# Patient Record
Sex: Male | Born: 1935 | Race: White | Hispanic: No | Marital: Single
Health system: Southern US, Community
[De-identification: ages and names within clinical notes are randomized; demographics above are authoritative.]

---

## 1998-04-22 ENCOUNTER — Inpatient Hospital Stay (HOSPITAL_COMMUNITY): Admission: RE | Admit: 1998-04-22 | Discharge: 1998-04-30 | Payer: Self-pay | Admitting: Vascular Surgery

## 2000-01-03 ENCOUNTER — Ambulatory Visit (HOSPITAL_COMMUNITY): Admission: RE | Admit: 2000-01-03 | Discharge: 2000-01-03 | Payer: Self-pay | Admitting: Ophthalmology

## 2000-06-29 ENCOUNTER — Encounter: Payer: Self-pay | Admitting: Orthopedic Surgery

## 2000-07-10 ENCOUNTER — Inpatient Hospital Stay (HOSPITAL_COMMUNITY): Admission: RE | Admit: 2000-07-10 | Discharge: 2000-07-15 | Payer: Self-pay | Admitting: Orthopedic Surgery

## 2000-10-04 ENCOUNTER — Encounter (INDEPENDENT_AMBULATORY_CARE_PROVIDER_SITE_OTHER): Payer: Self-pay | Admitting: Specialist

## 2000-10-04 ENCOUNTER — Ambulatory Visit (HOSPITAL_COMMUNITY): Admission: RE | Admit: 2000-10-04 | Discharge: 2000-10-04 | Payer: Self-pay | Admitting: Gastroenterology

## 2001-08-20 ENCOUNTER — Ambulatory Visit (HOSPITAL_COMMUNITY): Admission: RE | Admit: 2001-08-20 | Discharge: 2001-08-20 | Payer: Self-pay | Admitting: Ophthalmology

## 2001-08-20 ENCOUNTER — Encounter: Payer: Self-pay | Admitting: Ophthalmology

## 2006-06-29 ENCOUNTER — Inpatient Hospital Stay (HOSPITAL_COMMUNITY): Admission: AD | Admit: 2006-06-29 | Discharge: 2006-07-02 | Payer: Self-pay | Admitting: Internal Medicine

## 2006-08-16 ENCOUNTER — Encounter: Admission: RE | Admit: 2006-08-16 | Discharge: 2006-08-16 | Payer: Self-pay | Admitting: Neurology

## 2008-10-18 ENCOUNTER — Emergency Department (HOSPITAL_COMMUNITY): Admission: EM | Admit: 2008-10-18 | Discharge: 2008-10-18 | Payer: Self-pay | Admitting: Emergency Medicine

## 2008-11-23 ENCOUNTER — Inpatient Hospital Stay (HOSPITAL_COMMUNITY): Admission: EM | Admit: 2008-11-23 | Discharge: 2008-12-02 | Payer: Self-pay | Admitting: Emergency Medicine

## 2009-05-06 ENCOUNTER — Emergency Department (HOSPITAL_COMMUNITY): Admission: EM | Admit: 2009-05-06 | Discharge: 2009-05-06 | Payer: Self-pay | Admitting: Emergency Medicine

## 2010-01-03 ENCOUNTER — Emergency Department (HOSPITAL_COMMUNITY): Admission: EM | Admit: 2010-01-03 | Discharge: 2010-01-04 | Payer: Self-pay | Admitting: Emergency Medicine

## 2010-04-10 ENCOUNTER — Inpatient Hospital Stay (HOSPITAL_COMMUNITY): Admission: EM | Admit: 2010-04-10 | Discharge: 2010-04-12 | Payer: Self-pay | Admitting: Emergency Medicine

## 2010-10-22 ENCOUNTER — Emergency Department (HOSPITAL_COMMUNITY)
Admission: EM | Admit: 2010-10-22 | Discharge: 2010-10-23 | Payer: Self-pay | Source: Home / Self Care | Admitting: Emergency Medicine

## 2010-10-23 ENCOUNTER — Inpatient Hospital Stay (HOSPITAL_COMMUNITY)
Admission: EM | Admit: 2010-10-23 | Discharge: 2010-10-28 | Disposition: A | Discharge status: Hospice | Payer: Self-pay | Source: Home / Self Care

## 2011-01-31 LAB — URINALYSIS, ROUTINE W REFLEX MICROSCOPIC
Bilirubin Urine: NEGATIVE
Ketones, ur: 15 mg/dL — AB
Leukocytes, UA: NEGATIVE
Nitrite: NEGATIVE
Protein, ur: 30 mg/dL — AB

## 2011-01-31 LAB — CBC
HCT: 27.8 % — ABNORMAL LOW (ref 39.0–52.0)
HCT: 31.5 % — ABNORMAL LOW (ref 39.0–52.0)
HCT: 31.7 % — ABNORMAL LOW (ref 39.0–52.0)
Hemoglobin: 10.1 g/dL — ABNORMAL LOW (ref 13.0–17.0)
Hemoglobin: 10.3 g/dL — ABNORMAL LOW (ref 13.0–17.0)
Hemoglobin: 12.4 g/dL — ABNORMAL LOW (ref 13.0–17.0)
Hemoglobin: 12.9 g/dL — ABNORMAL LOW (ref 13.0–17.0)
Hemoglobin: 9.9 g/dL — ABNORMAL LOW (ref 13.0–17.0)
MCH: 31.1 pg (ref 26.0–34.0)
MCH: 31.3 pg (ref 26.0–34.0)
MCH: 31.8 pg (ref 26.0–34.0)
MCHC: 32.1 g/dL (ref 30.0–36.0)
MCHC: 33.5 g/dL (ref 30.0–36.0)
MCHC: 33.7 g/dL (ref 30.0–36.0)
MCV: 93.3 fL (ref 78.0–100.0)
MCV: 93.7 fL (ref 78.0–100.0)
MCV: 94.5 fL (ref 78.0–100.0)
MCV: 96.4 fL (ref 78.0–100.0)
Platelets: 146 10*3/uL — ABNORMAL LOW (ref 150–400)
Platelets: 82 10*3/uL — ABNORMAL LOW (ref 150–400)
RBC: 3.11 MIL/uL — ABNORMAL LOW (ref 4.22–5.81)
RBC: 3.25 MIL/uL — ABNORMAL LOW (ref 4.22–5.81)
RBC: 3.29 MIL/uL — ABNORMAL LOW (ref 4.22–5.81)
RBC: 3.99 MIL/uL — ABNORMAL LOW (ref 4.22–5.81)
RBC: 4.15 MIL/uL — ABNORMAL LOW (ref 4.22–5.81)
RDW: 14.1 % (ref 11.5–15.5)
RDW: 14.2 % (ref 11.5–15.5)
WBC: 11.2 10*3/uL — ABNORMAL HIGH (ref 4.0–10.5)
WBC: 6 10*3/uL (ref 4.0–10.5)
WBC: 8.4 10*3/uL (ref 4.0–10.5)

## 2011-01-31 LAB — BASIC METABOLIC PANEL
BUN: 6 mg/dL (ref 6–23)
CO2: 17 mEq/L — ABNORMAL LOW (ref 19–32)
CO2: 19 mEq/L (ref 19–32)
CO2: 20 mEq/L (ref 19–32)
CO2: 21 mEq/L (ref 19–32)
Calcium: 7.5 mg/dL — ABNORMAL LOW (ref 8.4–10.5)
Calcium: 8.1 mg/dL — ABNORMAL LOW (ref 8.4–10.5)
Chloride: 112 mEq/L (ref 96–112)
Chloride: 113 mEq/L — ABNORMAL HIGH (ref 96–112)
Chloride: 113 mEq/L — ABNORMAL HIGH (ref 96–112)
Creatinine, Ser: 0.66 mg/dL (ref 0.4–1.5)
Creatinine, Ser: 0.94 mg/dL (ref 0.4–1.5)
GFR calc Af Amer: >60 mL/min (ref >60)
GFR calc Af Amer: >60 mL/min (ref >60)
GFR calc Af Amer: >60 mL/min (ref >60)
GFR calc Af Amer: >60 mL/min (ref >60)
GFR calc non Af Amer: >60 mL/min (ref >60)
GFR calc non Af Amer: >60 mL/min (ref >60)
Glucose, Bld: 197 mg/dL — ABNORMAL HIGH (ref 70–99)
Glucose, Bld: 76 mg/dL (ref 70–99)
Glucose, Bld: 84 mg/dL (ref 70–99)
Potassium: 3 mEq/L — ABNORMAL LOW (ref 3.5–5.1)
Potassium: 3.3 mEq/L — ABNORMAL LOW (ref 3.5–5.1)
Potassium: 3.8 mEq/L (ref 3.5–5.1)
Potassium: 3.8 mEq/L (ref 3.5–5.1)
Sodium: 136 mEq/L (ref 135–145)
Sodium: 137 mEq/L (ref 135–145)
Sodium: 138 mEq/L (ref 135–145)
Sodium: 138 mEq/L (ref 135–145)

## 2011-01-31 LAB — POCT I-STAT, CHEM 8
Calcium, Ion: 1.11 mmol/L — ABNORMAL LOW (ref 1.12–1.32)
Creatinine, Ser: 0.8 mg/dL (ref 0.4–1.5)
TCO2: 24 mmol/L (ref 0–100)

## 2011-01-31 LAB — MAGNESIUM
Magnesium: 1.8 mg/dL (ref 1.5–2.5)
Magnesium: 1.8 mg/dL (ref 1.5–2.5)

## 2011-01-31 LAB — HEPATIC FUNCTION PANEL
Albumin: 3.8 g/dL (ref 3.5–5.2)
Alkaline Phosphatase: 64 U/L (ref 39–117)
Indirect Bilirubin: 0.9 mg/dL (ref 0.3–0.9)
Total Bilirubin: 1.1 mg/dL (ref 0.3–1.2)

## 2011-01-31 LAB — URINE CULTURE

## 2011-01-31 LAB — COMPREHENSIVE METABOLIC PANEL
Albumin: 2.1 g/dL — ABNORMAL LOW (ref 3.5–5.2)
BUN: 14 mg/dL (ref 6–23)
Calcium: 7.2 mg/dL — ABNORMAL LOW (ref 8.4–10.5)
Creatinine, Ser: 0.63 mg/dL (ref 0.4–1.5)
Glucose, Bld: 92 mg/dL (ref 70–99)
Total Protein: 4.1 g/dL — ABNORMAL LOW (ref 6.0–8.3)

## 2011-01-31 LAB — DIFFERENTIAL
Basophils Absolute: 0 10*3/uL (ref 0.0–0.1)
Basophils Relative: 0 % (ref 0–1)
Eosinophils Absolute: 0 10*3/uL (ref 0.0–0.7)
Eosinophils Relative: 0 % (ref 0–5)
Eosinophils Relative: 0 % (ref 0–5)
Neutro Abs: 12.1 10*3/uL — ABNORMAL HIGH (ref 1.7–7.7)
Neutro Abs: 7.5 10*3/uL (ref 1.7–7.7)
Neutrophils Relative %: 90 % — ABNORMAL HIGH (ref 43–77)

## 2011-01-31 LAB — PROTIME-INR: INR: 1.12 (ref 0.00–1.49)

## 2011-01-31 LAB — LIPASE, BLOOD: Lipase: 28 U/L (ref 11–59)

## 2011-01-31 LAB — URINE MICROSCOPIC-ADD ON

## 2011-01-31 LAB — POTASSIUM: Potassium: 3.4 mEq/L — ABNORMAL LOW (ref 3.5–5.1)

## 2011-01-31 LAB — TYPE AND SCREEN: Antibody Screen: NEGATIVE

## 2011-02-06 LAB — BASIC METABOLIC PANEL
BUN: 21 mg/dL (ref 6–23)
BUN: 24 mg/dL — ABNORMAL HIGH (ref 6–23)
Calcium: 8.1 mg/dL — ABNORMAL LOW (ref 8.4–10.5)
Creatinine, Ser: 0.88 mg/dL (ref 0.4–1.5)
GFR calc non Af Amer: >60 mL/min (ref >60)
GFR calc non Af Amer: >60 mL/min (ref >60)
Glucose, Bld: 102 mg/dL — ABNORMAL HIGH (ref 70–99)
Glucose, Bld: 87 mg/dL (ref 70–99)
Potassium: 3.9 mEq/L (ref 3.5–5.1)
Potassium: 4.4 mEq/L (ref 3.5–5.1)

## 2011-02-06 LAB — CULTURE, BLOOD (ROUTINE X 2): Culture: NO GROWTH

## 2011-02-06 LAB — CBC
HCT: 29.3 % — ABNORMAL LOW (ref 39.0–52.0)
HCT: 31.2 % — ABNORMAL LOW (ref 39.0–52.0)
HCT: 33.5 % — ABNORMAL LOW (ref 39.0–52.0)
Hemoglobin: 10 g/dL — ABNORMAL LOW (ref 13.0–17.0)
MCV: 95.3 fL (ref 78.0–100.0)
MCV: 95.8 fL (ref 78.0–100.0)
Platelets: 111 10*3/uL — ABNORMAL LOW (ref 150–400)
Platelets: 91 10*3/uL — ABNORMAL LOW (ref 150–400)
RBC: 3.07 MIL/uL — ABNORMAL LOW (ref 4.22–5.81)
RDW: 13.8 % (ref 11.5–15.5)
RDW: 14 % (ref 11.5–15.5)
WBC: 10.7 10*3/uL — ABNORMAL HIGH (ref 4.0–10.5)
WBC: 5.2 10*3/uL (ref 4.0–10.5)

## 2011-02-06 LAB — URINALYSIS, ROUTINE W REFLEX MICROSCOPIC
Bilirubin Urine: NEGATIVE
Specific Gravity, Urine: 1.011 (ref 1.005–1.030)
pH: 6 (ref 5.0–8.0)

## 2011-02-06 LAB — DIFFERENTIAL
Basophils Absolute: 0 10*3/uL (ref 0.0–0.1)
Basophils Absolute: 0 10*3/uL (ref 0.0–0.1)
Eosinophils Absolute: 0 10*3/uL (ref 0.0–0.7)
Eosinophils Relative: 0 % (ref 0–5)
Eosinophils Relative: 0 % (ref 0–5)
Lymphocytes Relative: 5 % — ABNORMAL LOW (ref 12–46)
Lymphocytes Relative: 5 % — ABNORMAL LOW (ref 12–46)
Lymphs Abs: 0.5 10*3/uL — ABNORMAL LOW (ref 0.7–4.0)
Neutro Abs: 10.1 10*3/uL — ABNORMAL HIGH (ref 1.7–7.7)
Neutrophils Relative %: 92 % — ABNORMAL HIGH (ref 43–77)
Neutrophils Relative %: 95 % — ABNORMAL HIGH (ref 43–77)

## 2011-02-06 LAB — URINE MICROSCOPIC-ADD ON

## 2011-02-06 LAB — URINE CULTURE: Special Requests: NEGATIVE

## 2011-02-06 LAB — LIPASE, BLOOD: Lipase: 16 U/L (ref 11–59)

## 2011-02-27 LAB — COMPREHENSIVE METABOLIC PANEL
Albumin: 3.9 g/dL (ref 3.5–5.2)
Alkaline Phosphatase: 73 U/L (ref 39–117)
BUN: 28 mg/dL — ABNORMAL HIGH (ref 6–23)
Creatinine, Ser: 0.86 mg/dL (ref 0.4–1.5)
Glucose, Bld: 100 mg/dL — ABNORMAL HIGH (ref 70–99)
Potassium: 4.4 mEq/L (ref 3.5–5.1)
Total Protein: 7 g/dL (ref 6.0–8.3)

## 2011-02-27 LAB — URINALYSIS, ROUTINE W REFLEX MICROSCOPIC
Nitrite: NEGATIVE
Specific Gravity, Urine: 1.017 (ref 1.005–1.030)
Urobilinogen, UA: 1 mg/dL (ref 0.0–1.0)
pH: 7 (ref 5.0–8.0)

## 2011-02-27 LAB — CBC
HCT: 36.7 % — ABNORMAL LOW (ref 39.0–52.0)
Hemoglobin: 12.5 g/dL — ABNORMAL LOW (ref 13.0–17.0)
MCHC: 34 g/dL (ref 30.0–36.0)
MCV: 96.8 fL (ref 78.0–100.0)
Platelets: 131 10*3/uL — ABNORMAL LOW (ref 150–400)
RDW: 13.7 % (ref 11.5–15.5)

## 2011-02-27 LAB — URINE CULTURE: Colony Count: 15000

## 2011-02-27 LAB — DIFFERENTIAL
Basophils Absolute: 0 10*3/uL (ref 0.0–0.1)
Basophils Relative: 0 % (ref 0–1)
Lymphocytes Relative: 16 % (ref 12–46)
Monocytes Absolute: 0.4 10*3/uL (ref 0.1–1.0)
Monocytes Relative: 4 % (ref 3–12)
Neutro Abs: 8.4 10*3/uL — ABNORMAL HIGH (ref 1.7–7.7)
Neutrophils Relative %: 79 % — ABNORMAL HIGH (ref 43–77)

## 2011-03-04 ENCOUNTER — Emergency Department (HOSPITAL_COMMUNITY)
Admission: EM | Admit: 2011-03-04 | Discharge: 2011-03-04 | Disposition: A | Discharge status: Home / Self Care | Payer: Medicare Other | Attending: Emergency Medicine | Admitting: Emergency Medicine

## 2011-03-04 ENCOUNTER — Emergency Department (HOSPITAL_COMMUNITY): Payer: Medicare Other

## 2011-03-04 DIAGNOSIS — R4182 Altered mental status, unspecified: Secondary | ICD-10-CM | POA: Insufficient documentation

## 2011-03-04 DIAGNOSIS — G238 Other specified degenerative diseases of basal ganglia: Secondary | ICD-10-CM | POA: Insufficient documentation

## 2011-03-04 DIAGNOSIS — F0280 Dementia in other diseases classified elsewhere without behavioral disturbance: Secondary | ICD-10-CM | POA: Insufficient documentation

## 2011-03-04 DIAGNOSIS — Z66 Do not resuscitate: Secondary | ICD-10-CM | POA: Insufficient documentation

## 2011-03-04 LAB — CBC
MCH: 30.8 pg (ref 26.0–34.0)
MCHC: 33 g/dL (ref 30.0–36.0)
MCV: 93.3 fL (ref 78.0–100.0)
Platelets: 85 10*3/uL — ABNORMAL LOW (ref 150–400)
RDW: 14.2 % (ref 11.5–15.5)

## 2011-03-04 LAB — URINALYSIS, ROUTINE W REFLEX MICROSCOPIC
Glucose, UA: NEGATIVE mg/dL
Protein, ur: NEGATIVE mg/dL
Specific Gravity, Urine: 1.022 (ref 1.005–1.030)

## 2011-03-04 LAB — DIFFERENTIAL
Basophils Relative: 0 % (ref 0–1)
Eosinophils Absolute: 0 10*3/uL (ref 0.0–0.7)
Eosinophils Relative: 0 % (ref 0–5)
Lymphs Abs: 1.5 10*3/uL (ref 0.7–4.0)
Monocytes Absolute: 0.5 10*3/uL (ref 0.1–1.0)
Monocytes Relative: 5 % (ref 3–12)

## 2011-03-04 LAB — COMPREHENSIVE METABOLIC PANEL
Albumin: 3.3 g/dL — ABNORMAL LOW (ref 3.5–5.2)
BUN: 26 mg/dL — ABNORMAL HIGH (ref 6–23)
Calcium: 8.4 mg/dL (ref 8.4–10.5)
Chloride: 108 mEq/L (ref 96–112)
Creatinine, Ser: 0.77 mg/dL (ref 0.4–1.5)
Total Bilirubin: 0.8 mg/dL (ref 0.3–1.2)
Total Protein: 5.6 g/dL — ABNORMAL LOW (ref 6.0–8.3)

## 2011-03-04 LAB — URINE MICROSCOPIC-ADD ON

## 2011-03-05 LAB — URINE CULTURE: Culture: NO GROWTH

## 2011-03-06 LAB — COMPREHENSIVE METABOLIC PANEL
ALT: <8 U/L (ref 0–53)
Calcium: 8.8 mg/dL (ref 8.4–10.5)
Creatinine, Ser: 0.81 mg/dL (ref 0.4–1.5)
GFR calc Af Amer: >60 mL/min (ref >60)
Glucose, Bld: 109 mg/dL — ABNORMAL HIGH (ref 70–99)
Sodium: 137 mEq/L (ref 135–145)
Total Protein: 6.1 g/dL (ref 6.0–8.3)

## 2011-03-06 LAB — POCT I-STAT, CHEM 8
Chloride: 105 mEq/L (ref 96–112)
Creatinine, Ser: 1 mg/dL (ref 0.4–1.5)
Glucose, Bld: 94 mg/dL (ref 70–99)
Potassium: 4.2 mEq/L (ref 3.5–5.1)

## 2011-03-06 LAB — BASIC METABOLIC PANEL
BUN: 18 mg/dL (ref 6–23)
BUN: 28 mg/dL — ABNORMAL HIGH (ref 6–23)
Calcium: 8.5 mg/dL (ref 8.4–10.5)
Calcium: 8.7 mg/dL (ref 8.4–10.5)
GFR calc non Af Amer: >60 mL/min (ref >60)
GFR calc non Af Amer: >60 mL/min (ref >60)
Glucose, Bld: 119 mg/dL — ABNORMAL HIGH (ref 70–99)
Glucose, Bld: 97 mg/dL (ref 70–99)
Potassium: 4.2 mEq/L (ref 3.5–5.1)

## 2011-03-06 LAB — URINE MICROSCOPIC-ADD ON

## 2011-03-06 LAB — URINALYSIS, ROUTINE W REFLEX MICROSCOPIC
Bilirubin Urine: NEGATIVE
Bilirubin Urine: NEGATIVE
Hgb urine dipstick: NEGATIVE
Ketones, ur: NEGATIVE mg/dL
Nitrite: NEGATIVE
Specific Gravity, Urine: 1.024 (ref 1.005–1.030)
Urobilinogen, UA: 1 mg/dL (ref 0.0–1.0)
pH: 6.5 (ref 5.0–8.0)

## 2011-03-06 LAB — CBC
HCT: 32.6 % — ABNORMAL LOW (ref 39.0–52.0)
Hemoglobin: 11.4 g/dL — ABNORMAL LOW (ref 13.0–17.0)
Hemoglobin: 11.9 g/dL — ABNORMAL LOW (ref 13.0–17.0)
MCHC: 33 g/dL (ref 30.0–36.0)
MCHC: 33.7 g/dL (ref 30.0–36.0)
Platelets: 131 10*3/uL — ABNORMAL LOW (ref 150–400)
RBC: 3.67 MIL/uL — ABNORMAL LOW (ref 4.22–5.81)
RDW: 13.7 % (ref 11.5–15.5)
RDW: 14.2 % (ref 11.5–15.5)

## 2011-03-06 LAB — BLOOD GAS, ARTERIAL
Bicarbonate: 21.9 mEq/L (ref 20.0–24.0)
TCO2: 23 mmol/L (ref 0–100)
pCO2 arterial: 35.3 mmHg (ref 35.0–45.0)
pH, Arterial: 7.41 (ref 7.350–7.450)

## 2011-03-06 LAB — VITAMIN B1: Vitamin B1 (Thiamine): 15 nmol/L (ref 9–44)

## 2011-03-06 LAB — VITAMIN B12: Vitamin B-12: 579 pg/mL (ref 211–911)

## 2011-03-06 LAB — CK TOTAL AND CKMB (NOT AT ARMC)
CK, MB: 2.6 ng/mL (ref 0.3–4.0)
Total CK: 82 U/L (ref 7–232)

## 2011-03-06 LAB — TSH: TSH: 0.848 u[IU]/mL (ref 0.350–4.500)

## 2011-03-06 LAB — PROTIME-INR: Prothrombin Time: 14.7 seconds (ref 11.6–15.2)

## 2011-04-04 NOTE — Procedures (Signed)
EEG NUMBER:  [id number].   This is a male gender patient, right-handed, is described as admitted  with hallucinations, confusional state, increased agitation, and  possible delirium.  He is currently in room #5506, [facility].   REFERRING PHYSICIAN:  Dr. Valentina Lucks.   This study was performed on November 25, 2008 as a portable EEG.   The patient was neither exposed to hyperventilation nor photic  stimulation.  He is described as asleep during this recording.   He is on the following medications:  1. Sinemet.  2. Lovenox.  3. ProAmatine.  4. Seroquel.  5. Zoloft.  6. Tylenol.  7. Ambien.   He was admitted on November 23, 2008 with hallucinations, confusing trip  versus home presenting with increasing gait difficulties and had been  falling.  It was also described that he began to drool over the last  week and had abrupt changes noted.  Tremor is seen by the nurses  throughout the time in his hospital stay.  Since he has a past medical  history of Parkinson's disease and macular degeneration, some of his  visual hallucinations may be attributed to the underlying disorders.   The patient is described as snoring with spontaneous body jerks or  myoclonic jerks after the recording began.  There is an irregular EKG  rhythm seen.    The patient has a heart rhythm of about 58-72 beats per minute and  seems to have possibly a heart block, given the frequently skipped beats  and the prolonged QRS intervals.  Due to tremor artifact and movement artifact,  EEG activity is overshadowed, but the patient is, according to the  technician, not combative rather restless and was able to relax again.  He  continued to sleep after a brief awakening.  A posterior dominant rhythm is difficult to establish in the patient,  who seems to have difficulties keeping his eyes closed unless asleep.  A posterior dominant rhythm was difficult to establish and appears to be  close to 7 Hz.  Again, due to the  continued artifact, facial twitching,  and movements, this study is difficult to interpret.  I would like this  study to be repeated, if possible, within the EEG Laboratory to exclude  external disturbances.  When the patient was deep asleep, his movement  artifacts became more rare.  Except for symmetric sleep architecture,  there was no other information  extracted.  Epileptiform discharges were not noted, neither triphasic waves or focal  slowing.      Melvyn Novas, M.D.  Electronically Signed     [id number]  D:  11/26/2008 15:24:38  T:  11/27/2008 04:10:04  Job #:  [id number]

## 2011-04-04 NOTE — Discharge Summary (Signed)
NAME:  Michael Sexton, Michael Sexton NO.:  [id number]   MEDICAL RECORD NO.:  [id number]          PATIENT TYPE:  INP   LOCATION:  5503                         FACILITY:  [facility]   PHYSICIAN:  Thora Lance, M.D.  DATE OF BIRTH:  [birth year]   DATE OF ADMISSION:  11/23/2008  DATE OF DISCHARGE:                               DISCHARGE SUMMARY   REASON FOR ADMISSION:  An 77 year old white male with a history of  Parkinson's disease taking Sinemet who entered 2 days prior to  admission, had been unable to sleep, had been hallucinating wires, birds  and people in his apartment and had been somewhat agitated and paranoid.  He had sustained a couple of falls without severe injuries.  He was  brought to the emergency room because of severe confusion.   PHYSICAL EXAMINATION:  VITAL SIGNS: Temperature 97.5, blood pressure  160/86, heart rate 69, respirations 18.  GENERAL:  An elderly, slightly disheveled male.  LUNGS:  Clear.  HEART:  Regular rate and rhythm without murmur, gallops or rub.  ABDOMEN:  Benign.  NEUROLOGIC:  Strength 5/5 in all extremities.  Cranial nerves II through  XII intact.  Fine tremor in both upper extremities.   LABORATORY DATA:  Sodium 138, potassium 4.2, creatinine 1.  Urinalysis  normal.  WBC 10.9, hemoglobin 12.9.  Chest x-ray:  No acute  cardiopulmonary disease.  CT scan of the head, no acute disease.   HOSPITAL COURSE:  1. Delirium.  The patient was admitted with acute delirium.  His      carbidopa dose was initially decreased to b.i.d.  An MRI/MRA of the      brain was done and showed no acute ischemia, nonspecific      subcortical white matter changes and some mild inflammatory changes      in the maxillary, ethmoid and frontal sinuses.  The patient was      seen by Dr. Anne Hahn of the neurology service.  His assessment was      delirium, likely related to a metabolic process, although a      definite etiology was not clear.  TSH, B12, thiamine and  ammonia      level were obtained and were unremarkable.  The patient was started      on Seroquel 25 mg h.s., which was titrated up to 50 mg nightly.      Over time, the patient's delirium improve markedly and he was alert      and oriented during the daytime.  He did still have some nocturnal      hallucinations of people in his room, but they did not agitate or      trouble him.  He tolerated Seroquel well without any over sedation      or anticholinergic effects.  The possibility of side effects and      long-term complications such as diabetes and stroke risk were      discussed with the patient and the family, and it was discussed      that the intention would be to taper off  Seroquel as feasible in      the future.  The patient was also treated with some IV fluids for      some very mild dehydration during the first three hospital days.  2. Parkinson's disease.  Again, neurology saw the patient.  They felt      that the Sinemet was unlikely cause of the hallucinations and      delirium and the Sinemet dose was increased back to t.i.d.  The      patient does have some cogwheeling and had significant gait      instability and was seen by physical therapy.  He was gradually      improving at discharge, but it was felt that he was not safe to be      discharged home at this point and a short-term nursing home stay      for rehabilitation was recommended and pursued.  3. Right carpal tunnel syndrome.  The patient complained of pain in      his right hand and fingers at night.  A wrist extension splint was      obtained and this helped considerably with the patient's pain      improving and his sleep quality improving throughout the      hospitalization.  We may address this as an inpatient and if      stabilized the patient may be a candidate for surgery.  4. Constipation.  The patient did have some issues with constipation      which were successfully treated with Senokot and  mobilization.  5. Right knee effusion.  The patient had some pain in the right knee      with a documented effusion.  He was treated with a corticosteroid      injection on November 29, 2008, in the right knee.  Within 24 hours,      he has significant improvement and had less pain while ambulating.   DISCHARGE DIAGNOSES:  1. Delirium/psychosis  2. Parkinson's disease.  3. Right carpal syndrome.  4. Right knee degenerative joint disease.  5. Constipation.  6. Orthostatic hypotension.  7. Mild depression.   PROCEDURES:  1. MRI/MRA of the brain.  2. CT scan of the brain.   DISCHARGE MEDICATIONS:  1. Midodrine 2.5 mg 1 p.o. t.i.d.  2. Sertraline 25 mg 1 p.o. daily.  3. Sinemet 25/100 mg tablets 1 p.o. t.i.d.  4. Seroquel 50 mg 1 p.o. nightly.  5. Senokot S 2 p.o. nightly p.r.n. for constipation.  6. Vicodin 5/500 one p.o. q.6 h., p.r.n. for pain.  7. Haldol 1 gram p.o. q.6 h., p.r.n. for pain.   DISPOSITION:  Discharged to nursing home.   DIET:  Regular diet.   ACTIVITY:  As per physical therapy.   CODE STATUS:  Full code.           ______________________________  Thora Lance, M.D.     JJG/MEDQ  D:  12/01/2008  T:  12/01/2008  Job:  [id number]   cc:   Genene Churn. Love, M.D.

## 2011-04-04 NOTE — Consult Note (Signed)
NAME:  Michael Sexton, Michael Sexton NO.:  [id number]   MEDICAL RECORD NO.:  [id number]           PATIENT TYPE:   LOCATION:                                 FACILITY:   PHYSICIAN:  Marlan Palau, M.D.  DATE OF BIRTH:  [birth year]   DATE OF CONSULTATION:  DATE OF DISCHARGE:                                 CONSULTATION   CHIEF COMPLAINT:  Hallucinations and history of Parkinson's disease.   HISTORY OF PRESENT ILLNESS:  This is an 77 year old white male well  known to Dr. Sandria Manly of [facility].  The patient has  been seen by Dr. Sandria Manly for his Parkinson's disease in the past.  He is  currently admitted to [facility] due to increased agitation,  hallucinations, and inability to sleep.  According to the daughter,  since this past Sunday, November 22, 2008, the patient had an abrupt  change in his his thought process, sleep, and gait.  She states he has  been hallucinating.  He has been having visual hallucinations at home  and while in the hospital.  These include hallucinations of cats, wires  hanging from the ceiling, people sleeping, and his wife doing cocaine.  In addition, according to the daughter, he has also had increased  problems with drooling and difficulty keeping saliva in his oral cavity.  He has had increased masked facies with trouble keeping his mouth  closed.  He has also had difficulty with his gait.  Usually, prior to  admission to the hospital, the patient was able to ambulate with the use  of walker.  However, his daughter has noticed recently he has been  significantly more shaky with his knees and tending to have retropulsion  more than usual.  She has also noticed that he has been shuffling his  gait and having difficulty with tripping over his own feet.  While in  hospital on the night of November 23, 2008, nurse noticed while the  patient was sleeping that he had significant amount of tremulous  activity that lasted for multiple minutes  possibly up to 15 minutes.  The patient was not awake at this time and did not have incontinence or  bite his tongue, but should be noted on exam he also had twitching of  his myoclonus of his left arm.   PAST MEDICAL HISTORY:  Positive for macular degeneration, Parkinson's  disease, orthostatic hypertension, degenerative joint disease, B12  deficiency, pernicious anemia, gout, carpal tunnel syndrome, left total  knee arthroplasty, and basal cell carcinoma of skin.   MEDICATIONS:  Carbidopa and levodopa 25/100 t.i.d., while in hospital  has been decreased b.i.d., midodrine 2.5 mg t.i.d., and sertraline 25 mg  daily.  The patient was also while in hospital placed on Zyprexa which  has now been discontinued and replaced with Seroquel.   ALLERGIES:  DEMEROL which causes hallucinations.   SOCIAL HISTORY:  The patient does not smoke, drink, or do illicit drugs.  He lives with his wife who is 50 years old and is cared for by his wife  and  his daughter.   REVIEW OF SYSTEMS:  All 10 review of systems are negative with the  exception of above.   PHYSICAL EXAMINATION:  VITAL SIGNS:  Blood pressure is 114/68, pulse 78,  respiratory rate 20, and temperature 98.8.  MENTAL STATUS:  The patient is alert.  He is oriented to place, day,  month, and objects.  However, he believes it is 1920.  The patient is  able to carry out 1 and 2-step commands without any difficulty.  He does  not have good enunciation and tends to speak with heavy breathing.  CRANIAL NERVES:  Pupils are equal, round, reactive, and accommodating to  light.  Conjugate gaze.  Extraocular muscles are intact.  Visual fields  are intact.  He does have masked facies and drooling bilaterally.  Face  is symmetrical.  Tongue is midline.  Uvula is midline.  Sensation is  full from V1-V3.  Shoulder shrug and head turn is within normal limits.  COORDINATION:  Finger-to-nose is slow, but within normal limits.  Heel-  to-shin again is slow,  but smooth.  Fine motor movements are slow and  clumsy.  Gait was not tested at this time.  MOTOR:  As stated, the  patient does have a resting tremor bilateral upper extremities with  right being greater than left.  He does have myoclonus of his left arm  when bilateral arms are held out in front of him.  Muscle strength  globally is 5/5 with no clonus.  He does have good bulk and normal tone.  Deep tendon reflexes are 2+ throughout in the upper extremities, 1+  bilaterally in the patella, minimal at the Achilles, and he has  bilateral downgoing toes.  Sensation is full to pinprick, light touch,  and vibration on the upper extremities.  He does have decreased pinprick  and vibration sensation in his ankles and in his calves.  PULMONARY:  Clear to auscultation bilaterally.  CARDIOVASCULAR:  S1 and S2.  Regular rate and rhythm.  No murmurs.  NECK:  Negative for bruits and supple.   LABORATORY STUDIES:  PT and INR equal 14.7 and 1.1.  UA is negative.  Sodium is 137, potassium 3.8, chloride 105, CO2 is 24, BUN is 22,  creatinine 0.81, and glucose 109.  White blood cell count is 8.9,  platelets 154, hemoglobin and hematocrit 11.1 and 33.8.   IMAGING AND TESTS:  MRA shows 50% stenosis at the right ICA at the  proximal supraglenoid segment and question of stenosis at the right MCA  origins.  It also shows decreased caliber of just one-half at basilar  artery.  MRI shows no evidence of ischemia, did show white matter  changes most likely due to hypertension.  CT of head is negative for  bleed or mass.   TREATMENT AND PLAN:  This most likely is metabolic process and will  clear once the origin is discovered.  At this time, I am going to stop  the Zyprexa.  We will start Seroquel 25 mg at bedtime.  Obtain an EEG to  determine the origin of myoclonus.  Order TSH, B12, thiamine, ammonia  level, and blood gas as the patient's PO2 was 93 on room air today.  We  will continue to follow the patient  while he is in-house looking for  metabolic processes.  As stated, this may clear on its own; however, we  will try to find the etiology.      ______________________________  Felicie Morn, PA-C  Marlan Palau, M.D.  Electronically Signed    DS/MEDQ  D:  11/24/2008  T:  11/25/2008  Job:  [id number]   cc:   Thora Lance, M.D.

## 2011-04-04 NOTE — H&P (Signed)
NAMEJEANETTE, MOFFATT NO.:  [id number]   MEDICAL RECORD NO.:  [id number]          PATIENT TYPE:  INP   LOCATION:  1825                         FACILITY:  [facility]   PHYSICIAN:  Michiel Cowboy, MDDATE OF BIRTH:  [birth year]   DATE OF ADMISSION:  11/23/2008  DATE OF DISCHARGE:                              HISTORY & PHYSICAL   PRIMARY CARE PHYSICIAN:  Thora Lance, M.D.   CHIEF COMPLAINT:  Hallucinations.   HISTORY OF PRESENT ILLNESS:  The patient is an 77 year old gentleman  with history of Parkinson's disease taking Carbidopa and has not had any  recent changes who is here for  medications.  He does take occasional  Hydrocodone APAP for severe arthritis, but had been taking it in the  past without any problems.  For the past two days the patient has not  been able to sleep, has been hallucinating wires, birds, and other  things in his apartment and has been somewhat agitated, somewhat  paranoid, looking for people who are trying to rob his place.  He has  sustained a couple of falls although no severe injuries.  No head trauma  but his family brought him in because of severe confusion.  He is  actually currently oriented to situation himself but actively  hallucinating a cracker in his hand while talking to me.   REVIEW OF SYSTEMS:  Otherwise review of systems unremarkable.  Unable to  obtain full details from the patient but the family had not heard any  complaints, no nausea, no vomiting, no fevers, no chills, no chest  pains.   PAST MEDICAL HISTORY:  1. Significant for Parkinson's disease.  2. Orthostatic hypertension in the past.  3. Remote history of depression.  The patient has been followed by Dr. Sandria Manly for his Parkinson's disease.   SOCIAL HISTORY:  The patient does not smoke or drink.  He lives at home,  has a very attentive family.  He lives with his wife, has at least two  daughters.   FAMILY HISTORY:  Noncontributory.   ALLERGIES:   PERCOCET causes apparently seizure.   MEDICATIONS:  1. Carbidopa 25/100 mg three times a day.  2. Midodrine 2.5 mg three times a day.  3. Sertraline 25 mg once a day.  4. ICaps.  5. Tylenol.  6. Hydrocodone 60 mg as needed for pain.   PHYSICAL EXAMINATION:  VITAL SIGNS:  Temperature 97.5, blood pressure  160/86, pulse 69, respirations 18, saturating 98% on room air.  GENERAL:  The patient is elderly, slightly disheveled.  HEENT:  Head nontraumatic.  Somewhat dry mucous membranes.  Of note per  family he had been drooling a lot and there is a lot of drool on his  shirt.  LUNGS:  Clear to auscultation bilaterally.  HEART:  Regular rate and rhythm.  No murmurs, rubs, or gallops.  ABDOMEN:  Soft, nontender, nondistended.  EXTREMITIES:  Lower extremities without clubbing, cyanosis, or edema.  NEUROLOGIC:  Strength 5/5 in all four extremities.  Cranial nerves II-  XII intact.  There is a noticeable fine tremor  of both upper extremities  bilaterally.   LABORATORY DATA:  White blood cell count 10.9, hemoglobin 12.9.  Sodium  138, potassium 4.2, creatinine 1.  UA within normal limits.   Chest x-ray is stable, no cardiopulmonary disease.  CT scan of head  unremarkable.   ASSESSMENT/PLAN:  This is an 77 year old gentleman with history of  Parkinson's disease on Carbidopa, now with new hallucinations, otherwise  no neurological deficits.  1. Altered mental status plus hallucinations.  Could be related to his      medication.  Will decrease dose of Carbidopa.  I strongly recommend      neurology consult in a.m.  Hold Sertraline for right now.      Psychosis is a possibility but I think that this is more      neurological issue rather than psychiatry issue but if neurology      are not able to provide an answer, psychiatric consult may be      needed.  For completion sake will obtain MRI/MRA of brain to make      sure that there is no evidence of CVA resulting in the symptoms      would be  highly unlikely.  2. Orthostatic hypertension.  Will continue with Midodrine for now.      Check orthostatics.  3. Mild dehydration.  Give gentle IV fluids and follow.  4. Prophylaxis.  Protonix plus Lovenox.   CODE STATUS:  The patient wishes to be a full code as per his power of  attorney daughter.      Michiel Cowboy, MD  Electronically Signed     AVD/MEDQ  D:  11/23/2008  T:  11/24/2008  Job:  [id number]   cc:   Thora Lance, M.D.  Dr. Anner Crete

## 2011-04-07 NOTE — H&P (Signed)
[facility]  Patient:    Michael Sexton, Michael Sexton                            MRN: [id number] Adm. Date:  07/10/00 Attending:  Georges Lynch. Darrelyn Hillock, M.D. Dictator:   Grayland Jack, P.A.                         History and Physical  DATE OF BIRTH:  1917/11/07  CHIEF COMPLAINT:  Left knee pain.  HISTORY OF PRESENT ILLNESS:  The patient is an 77 year old male with a long standing history of bilateral knee pain, left more than right.  He has undergone numerous conservative therapies including oral anti-inflammatories and activity modifications as well as arthroscopic debridement.  His pain and discomfort has increased dramatically over the past few months, interfering with his activities of daily living.  X-rays of the left knee show severe genu varus with total collapse of the medial joint space.  He recently had surgery for an aortic aneurysm.  However, he has been given medical clearance from that standpoint.  It is felt at this time that he would best benefit from surgical intervention to include total knee arthroplasty.  The risks, benefits and complications of surgery were discussed with the patient in detail and he agrees to proceed.  ALLERGIES:  DEMEROL.  CURRENT MEDICATIONS:  None.  PAST MEDICAL HISTORY: 1. Hiatal hernia. 2. Abdominal aortic aneurysm.  PAST SURGICAL HISTORY: 1. He had his left kidney removed in 1942. 2. Cholecystectomy. 3. Appendectomy. 4. Aortic aneurysm repair.  SOCIAL HISTORY:  The patient lives at home with his wife.  He has three children.  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  He denies tobacco and alcohol use.  FAMILY HISTORY:  Noncontributory.  REVIEW OF SYSTEMS:  GENERAL: The patient denies fever, chills, weight changes or malaise.  HEENT: The patient denies headaches, visual changes, ear pain or pressure, nasal congestion or discharge, sore throat or dysphagia. RESPIRATORY: The patient denies  productive cough, hemoptysis or shortness of breath.  CARDIAC: The patient denies chest pain, palpitations, angina or dyspnea on exertion.  GASTROINTESTINAL: The patient denies abdominal pain, nausea, vomiting, constipation, diarrhea or blood stools.  GENITOURINARY: The patient denies dysuria, hematuria, increased frequency or hesitancy. MUSCULOSKELETAL: See HPI.  PHYSICAL EXAMINATION:  VITAL SIGNS:  Temperature afebrile, pulse 72, respirations 16, blood pressure 130/70.  GENERAL:  Well-developed, well-nourished 77 year old male in no acute distress.  HEENT:  Normocephalic and atraumatic.  Pupils equal, round and reactive to light.  Extraocular muscles intact.  NECK:  Supple.  No JVD.  No lymphadenopathy.  No carotid bruits appreciated.  CHEST:  Clear to auscultation.  No rales.  No rhonchi.  No wheezing bilaterally.  HEART:  Regular rate and rhythm.  No murmurs, rubs, or gallops appreciated.  ABDOMEN:  Soft with positive bowel sounds.  Nontender.  No organomegaly appreciated.  BREASTS, RECTAL AND GENITOURINARY:  Not done.  Not pertinent to the present illness.  EXTREMITIES:  The patient is neurovascularly intact to bilateral lower extremities.  He does have decreased active range of motion of the left knee. Positive crepitation.  He is tender to palpation along the medial joint line.  IMPRESSION: 1. Osteoarthritis of the left knee. 2. Hiatal hernia. 3. Aortic aneurysm. 4. One kidney.  PLAN:  The patient will be admitted to [facility] on July 10, 2000 to undergo  a left total knee arthroplasty by Dr. Ranee Gosselin.  The postoperative course, including physical therapy, has been discussed with the patient in detail.  The patients primary care physician is Dr. Valentina Lucks.  he will assist [place] with the patients medical concerns as needed. DD:  06/28/00 TD:  06/28/00 Job: [id number]

## 2011-04-07 NOTE — Op Note (Signed)
[place]. [facility]  Patient:    Michael Sexton, Michael Sexton                          MRN: [id number] Proc. Date: 01/03/00 Adm. Date:  [id number] Disc. Date: [id number] Attending:  Ivor Messier CC:         Dr. Valentina Lucks                           Operative Report  PREOPERATIVE DIAGNOSIS:  Senile cataract, left eye.  POSTOPERATIVE DIAGNOSIS:  Senile cataract, left eye.  OPERATION:  Planned extracapsular cataract extraction with primary insertion of  posterior chamber intraocular lens implant, left eye.  SURGEON:   Guadelupe Sabin, M.D.  ASSISTANT:  Nurse.  ANESTHESIA:  Local 4% Xylocaine, 0.75 Marcaine.  Retrobulbar injection, topical  tetracaine, and intraocular Xylocaine.  Anesthesia standby required in this elderly patient.  The patient was given intravenous Ditropan during the period of retrobulbar block.  DESCRIPTION OF PROCEDURE:  After the patient was prepped and draped, a speculum was inserted in the left eye.  The eye was turned downward, and a superior rectus traction suture placed.  Schiotzs tonometry was recorded at 6 to 7 scale units with a 5.5 g weight.  A peritomy was performed adjacent to the limbus from the 1 to the 1 oclock position.  The corneoscleral junction was cleaned, and a corneoscleral groove made with a 45 degree superblade.  The anterior chamber was then entered with the diamond 2.5 mm keratome at the 12 oclock position and the 15 degree blade at the 2:30 position.  Using a bent 26-gauge needle on a Healon syringe, a circular capsulorrhexis was begun and completed with the Grabow forceps.  Hydrodissection and hydrodelineation were performed using 1% Xylocaine.  The 30 degree phacoemulsification tip was then inserted with slow emulsification of the lens brunescent nucleus.  Total ultrasonic time: 2 minutes 3 seconds.  Average power level: 14%.  Amount of fluid used during the emulsification: 75 cc. Following removal of the  nucleus, the residual cortex was aspirated with the irrigation aspiration tip.  The posterior capsule appeared intact with a brilliant red fundus reflex.  It was, therefore, elected to insert an [employer]er[employer] Optics S140NB silicone posterior chamber intraocular lens implant with UV absorber, diopter strength +21.00.  This was inserted with a McDonald forceps into the anterior chamber and then inserted into the capsular bag using the [facility] lens rotator.  The lens appeared to be well centered.  The Healon which had been used during the procedure was aspirated then replaced with balanced salt solution and Miochol to stimulate pupillary constriction.  The operative incisions appeared o be self sealing, and no sutures were required.  Eserine ophthalmic ointment was  instilled in the conjunctival cul-de-sac, and a light patch and protector shield applied.  Duration of procedure and anesthesia administration: 45 minutes.  The  patient tolerated the procedure well in general and left the operating room for the recovery room in good condition. DD:  01/03/00 TD:  01/03/00 Job: [id number]

## 2011-04-07 NOTE — Op Note (Signed)
[place]  Patient:    Michael Sexton, Michael Sexton Visit Number: [id number] MRN: [id number]          Service Type: Attending:  Guadelupe Sabin, M.D. Adm. Date:  08/20/01                             Operative Report  PREOPERATIVE DIAGNOSIS:  Senile nuclear cataract, right eye.  POSTOPERATIVE DIAGNOSIS:  Senile nuclear cataract, right eye.  OPERATION:  Planned extracapsular cataract extraction - phacoemulsification, primary insertion of posterior chamber intraocular lens implant.  SURGEON:  Guadelupe Sabin, M.D.  ASSISTANT:  Nurse.  ANESTHESIA:  Local 4% Xylocaine, 0.75% Marcaine, retrobulbar block, topical tetracaine, intraocular Xylocaine.  Anesthesia standby required in this elderly patient.  The patient given sodium pentothal intravenously during the period of retrobulbar injection.  DESCRIPTION OF PROCEDURE:  After the patient was prepped and draped, a lid speculum was inserted in the ______  eye.  Schiotz tonometry was recorded at 7 to 8 scale units with a 5.5 g weight.  A peritomy was performed adjacent to the limbus from the 11 oclock to 1 oclock position.  The corneoscleral junction was cleaned and a corneoscleral groove made with a 45 degree Superblade.  The anterior chamber was then entered with a 2.5 mm diamond Keratome and the 15 degree blade.  Using a bent 26 gauge needle on a Healon syringe, a circular capsular rectus was begun and then completed with the Grabow forceps.  Hydrodissection and hydrodelineation were performed using 1% Xylocaine.  The 30 degree phacoemulsification tip was then inserted with slow controlled emulsification of the lens nucleus.  Total ultrasonic time, 2 minute 2 seconds.  Most of this, however, was irrigation and aspiration with minimal phacoemulsification.  Average phacoemulsification time percent was 11%. Following removal of the nucleus, the residual cortex was aspirated with the irrigation aspiration  tip.  The posterior capsule appeared intact with a brilliant red fundus reflex.  It was therefore elected to insert an Aller[employer] Optics model SI40 MB silicone three-piece posterior chamber intraocular lens implant with UV absorber.  Diopter strength +20.50.  This was inserted with the McDonald forceps into the anterior chamber and then into the into the capsular bag using the [facility] lens rotator.  The lens appeared to be well centered.  The Healon, which had been used during the procedure, was aspirated, replaced with balanced salt solution and Miochol ophthalmic solution.  The patient was given Diamox 500 mg intravenously to lower the intraocular pressure which was slightly elevated.  Maxitrol ointment was instilled in the conjunctival cul-de-sac and a light patch and protector shield applied.  Duration of procedure and anesthesia administration, 45 minutes.  The patient tolerated the procedure well in general and left the operating room for the recovery room in good condition. Attending:  Guadelupe Sabin, M.D. DD:  08/20/01 TD:  08/20/01 Job: [phone] [id number]

## 2011-04-07 NOTE — H&P (Signed)
. [facility]  Patient:    Michael Sexton, Michael Sexton                          MRN: [id number] Adm. Date:  [id number] Disc. Date: [id number] Attending:  Ivor Messier CC:         Dr. Valentina Lucks (include associated lab data, EKG, or chest x-ra                         History and Physical  REASON FOR ADMISSION:  This was a planned outpatient surgical readmission of this 77 year old white male admitted for cataract implant surgery of the left eye.  HISTORY OF PRESENT ILLNESS:  This patient has been followed in my office for routine eye care since Apr 05, 1981.  Initial examination at that time revealed a visual acuity of 20/25 right eye, 20/25 left eye, with a multiple pin hole. Over the ensuing years, the patient has had refraction changes.  When he returned recently to the office in January 2000, nuclear cataract formation was present n both eyes, reducing his vision to 20/50 right eye, 20/200 left eye.  With refraction, however, vision could be improved slightly.  When he returned to the office recently, however, he elected to proceed with cataract implant surgery of the left eye first, right eye later, as he was having increasing difficulty doing the things that he needed to do and liked to do.  He was aware of smokey, blurred, or dim vision, difficulty with driving, seeing road signs, difficulty with reading, and difficulty noting color discrimination of dim or dull colors.  He signed an  informed consent, and arrangements were made for his outpatient admission at this time.  PAST MEDICAL HISTORY:  The patient is in stable general health under the care of his regular physician, Dr. Valentina Lucks.  He is felt to be a low risk for the proposed surgery.  MEDICATIONS:  The patient takes minimal medications under Dr. Valentina Lucks including  allopurinol, aspirin, and ferrous sulfate.  ALLERGIES:  He is said to be allergic to DEMEROL.  PHYSICAL  EXAMINATION:  VITAL SIGNS:  As recorded on admission, blood pressure 139/84, temperature 97.5, heart rate 61, respirations 20.  GENERAL:  The patient is a pleasant, well-nourished, well-developed, white male in no acute distress.  HEENT:  Eyes:  The eyes are white and clear with normal lids, lashes, conjunctivae, cornea, anterior chamber, and iris.  Moderate nuclear cataract formation is present in both eyes.  Fundus examination reveals a clear vitreous, attached retina with normal optic nerve, blood vessels, and macula.  CHEST:  Lungs clear to percussion and auscultation.  HEART:  Normal sinus rhythm.  No cardiomegaly, no murmurs.  ABDOMEN:  Negative.  EXTREMITIES:  negative.  ADMISSION DIAGNOSES:  Senile cataract, left eye.  SURGICAL PLAN:   Planned extracapsular cataract extraction with primary insertion of posterior chamber intraocular lens implant.DD:  01/03/00 TD:  01/03/00 Job: [id number]

## 2011-04-07 NOTE — Op Note (Signed)
[place]. [facility]  Patient:    Michael Sexton, Michael Sexton                          MRN: [id number] Proc. Date: 07/10/00 Adm. Date:  [id number] Attending:  Skip Mayer                           Operative Report  DATE OF BIRTH:  1917/06/14.  PREOPERATIVE DIAGNOSIS:  Severe degenerative arthritis of the left knee with complete collapse of medial joint space and a severe 30 degrees varus deformity.  POSTOPERATIVE DIAGNOSIS:  Severe degenerative arthritis of the left knee with complete collapse of medial joint space and a severe 30 degrees varus deformity.  OPERATION:  Left total knee arthroplasty utilizing the posterior cruciate sacrificing Osteonics prosthesis.  All three components were cemented.  The sizes used were a size 26 patella, size 9 left femur, size 9 tibial tray, and a 15 mm thickness tibial insert.  DESCRIPTION OF PROCEDURE:  Under spinal anesthesia, a routine orthopedic prepping and draping of the left lower extremity was carried out.  At this time, the patient was given 1 gram of IV Ancef.  The leg was exsanguinate with an Esmarch and a tourniquet was elevated at 350 mm of mercury.  Following that, an anterior approach to the left knee was carried out.  Bleeders were identified and cauterized.  At this time, two flaps were created and sutured in place.  I then carried out a median parapatellar approach.  The patella was reflected laterally.  I flexed the knee and excised the posterior cruciate ligament.  The anterior cruciate ligament was absent.  I did medial and lateral meniscectomies.  I did an extensive posterior release as well as medial release.  Once we had good soft tissue releases, we then made our initial drill hole in the intercondylar notch and inserted our intramedullary rod and a #1 jig.  We then removed 12 mm thickness off the distal femur. Following that, we then inserted our #2 jig and carried out anterior and posterior  chamfering cuts for a size 9 left femur.  After this, we prepared a tibia in the usual fashion.  We removed the appropriate amount of bone from the tibia.  He had a complete collapse of the medial plateau.  Once we removed all the medial spurs from the plateau and made our cut, we had a nice even plateau.  We did use the guiderod for the alignment.  We then cut our keel cut.  We used a size 9 tibial tray.  Following this, we then cut our box cut out of the femur in the usual fashion for a size 9 posterior cruciate sacrificing prosthesis.  We then went through our trials and selected a 15 mm thickness tibial insert, size 9 tibial tray, size 9 femur.  Once this was completed, we then went on and cut our groove in the patella, that is we removed 10 mm thickness off the articular surface of the patella, and then three drill holes were made in the patella.  We then removed the trials and made sure we had good soft tissue releases medially, thoroughly irrigated out the knee, dried the knee out, and cemented all three components in simultaneously.  Before we inserted our permanent tibial insert, we let the tourniquet down and made sure we had no further bleeding.  We did  not.  We thoroughly irrigated out the knee.  We inserted some thrombin soaked Gelfoam posteriorly.  We then dried the tibial tray out and then inserted our permanent size 15 mm thickness tibial tray insert.  Once this was done, the knee was reduced.  We had excellent motion; no lateral release was necessary. The tourniquet remained down.  We inserted one Hemovac drain and closed the wound in layers in the usual fashion.  Skin was closed with metal staples. Sterile Neosporin dressing was applied.  The patient left the operating room in satisfactory  condition. DD:  07/10/00 TD:  07/10/00 Job: [id number]

## 2011-04-07 NOTE — Discharge Summary (Signed)
[facility]  Patient:    Michael Sexton, Michael Sexton                          MRN: [id number] Adm. Date:  [id number] Disc. Date: [id number] Attending:  Skip Mayer Dictator:   Ralene Bathe, P.A.                           Discharge Summary  ADMITTING DIAGNOSES: 1. End-stage osteoarthritis left knee. 2. Gastroesophageal reflux disease, hiatal hernia. 3. Abdominal aortic aneurysm. 4. Patient has a history of having only one kidney.  DISCHARGE DIAGNOSES: 1. End-stage osteoarthritis left knee. 2. Gastroesophageal reflux disease, hiatal hernia. 3. Abdominal aortic aneurysm. 4. Patient has a history of having only one kidney. 5. Status post left total knee arthroplasty. 6. Postoperative hemorrhagic anemia, asymptomatic.  OPERATION:  Left total knee arthroplasty.  The surgeon was Dr. Darrelyn Hillock; the assistant was Dr. Montez Morita under spinal anesthesia.  BRIEF HISTORY:  This is an 77 year old male with end-stage osteoarthritis affecting the left knee, has failed conservative measures including laparoscopic wash-out, corticosteroid injections, and nonsteroidals, wishes to proceed with total knee arthroplasty, as this quite dysfunctional and is having significant affect of his ADLs.  The risks and benefits have been discussed with the patient, and he is in agreement and wishes to proceed.  HOSPITAL COURSE:  The patient was admitted and underwent the above-named procedure and tolerated this well.  All appropriate IV antibiotics and analgesics provided.  Postoperatively he was placed on Coumadin for DVT and PE prophylaxis.  He was weightbearing as tolerated with physical therapy per total knee replacement protocol.  He did have a postoperative hemorrhagic anemia that stabilized at 8.7 and 9.0 prior to discharge.  He remained asymptomatic; therefore, was placed on iron replacement supplements.  Overall, the patient remained hemodynamically stable and stayed medically  and orthopedically stable and worked with physical therapy and had met all discharge plans and goals prior to discharge on July 15, 2000.  He was afebrile.  He was neurovascularly intact.  CPM was at 90 degrees.  Incisions were stable, dry, and no drainage.  He was independent with ambulation.  At this time, he was felt medically and orthopedically stable for discharge to home.  LABORATORY DATA:  Admission hemogram with hemoglobin of 14.5 postoperatively 12.0, 10.2, 9.7, 8.7 and 9.0 respectively.  Protimes INRs followed by pharmacy on DVT and PE prophylaxis Coumadin.  Chemistries were normal on admission. Urinalysis showed moderate leukocyte esterase and many bacteria.  A repeat is not in the chart.  EKG shows sinus bradycardia otherwise normal.  CONDITION ON DISCHARGE:  Stable and improved.  DISCHARGE PLANS:  The patient is being discharged to home.  Follow up in our office two weeks postoperatively.  Outpatient physical therapy is arranged.  DISCHARGE MEDICATIONS: 1. Coumadin 5 mg daily. 2. Percocet #40, 1-2 every 4-6 p.r.n. pain. 3. Trinsicon 1 b.i.d.  He will have outpatient lab draws for his protimes.  Daily dressing changes p.r.n.  Resume home medications, home diet. DD:  08/29/00 TD:  08/30/00 Job: [id number]

## 2011-04-07 NOTE — Procedures (Signed)
[facility]  Patient:    Michael Sexton, Michael Sexton                         MRN: [id number] Proc. Date: 10/04/00 Attending:  Verlin Grills, M.D. CC:         Thora Lance, M.D.   Procedure Report  PROCEDURES:  Colonoscopy with polypectomy.  REFERRING PHYSICIAN:  Thora Lance, M.D.  PROCEDURE INDICATIONS:  Mr. Michael Sexton (date of birth [birth year]) is an 77 year old male who had never undergone colorectal neoplasia screening.  The patient is scheduled for surveillance colonoscopy to prevent colon cancer. He viewed our colonoscopy education film in my office.  I discussed with him the complications associated with colonoscopy and polypectomy, including intestinal bleeding and intestinal perforation.  Mr. Michael Sexton hs signed the operative permit.  MEDICATION ALLERGIES:  DEMEROL.  CHRONIC MEDICATIONS:  Aspirin.  PAST MEDICAL HISTORY:  1. 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  2. Remote cholecystectomy.  3. Abdominal aortic aneurysm repair.  4. Basal cell carcinoma of the skin removed.  5. Hydrocele surgery.  6. Left cataract surgery.  7. Gout.  8. Degenerative joint disease of the left knee, resulting in total left knee     replacement.  9. Mild carpal tunnel syndrome. 10. Herpes Zoster infection in 1998.  FAMILY HISTORY:  Negative for colorectal cancer.  ENDOSCOPIST:  Verlin Grills, M.D.  PREMEDICATION:  Fentanyl 50 mcg, Versed 7 mg.  ENDOSCOPE:  Olympus pediatric colonoscope.  PROCEDURE:  After obtaining informed consent, the patient was placed in the left lateral decubitus position.  I administered intravenous fentanyl and intravenous Versed to achieve conscious sedation throughout the procedure. The patients blood pressure, oxygen saturation and cardiac rhythm were monitored throughout the procedure and documented in the medical record.  Anal inspection was normal.  Digital rectal examination revealed a non-nodular prostate.  The Olympus  pediatric video colonoscope was introduced into the rectum and under direct vision advanced to the cecum (identified by normal-appearing ileocecal valve).  Colonic preparation for the examination today was excellent.  RECTUM:  Normal.  SIGMOID AND DESCENDING COLON:  Extensive left colonic diverticulosis.  At approximately 25 cm from the anal verge a 1 mm sessile polyp was removed with the cold snare.  SPLENIC FLEXURE:  Normal.  TRANSVERSE COLON:  From the proximal transverse colon a 3 mm sessile polyp was removed with the electrocautery snare.  HEPATIC FLEXURE:  Normal.  ASCENDING COLON:  Normal.  CECUM AND ILEOCECAL VALVE:  From the distal cecum a 5 mm sessile polyp was removed with the electrocautery snare.  From the proximal cecum two 1 mm sessile polyps were removed with the cold biopsy forceps.  ASSESSMENT: 1. Universal colonic diverticulosis. 2. Two small polyps from the proximal cecum, which were removed.  The polyp    from the proximal transverse colon were submitted in one bottle for    pathologic evaluation. 3. The 5 mm polyp removed from the distal cecum was submitted in one bottle    for pathologic evaluation. 4. The distal sigmoid colon polyp was submitted in one bottle for pathologic    evaluation.  RECOMMENDATIONS:  Repeat colonoscopy will be necessary, but I will await the pathology report to determine when the appropriate interval should be. DD:  10/04/00 TD:  10/04/00 Job: [id number]

## 2011-04-07 NOTE — Discharge Summary (Signed)
[facility]  Patient:    Michael Sexton, Michael Sexton                          MRN: [id number] Adm. Date:  [id number] Disc. Date: [id number] Attending:  Skip Mayer Dictator:   Ralene Bathe, P.A.                           Discharge Summary  ADMISSION DIAGNOSES: 1. End-stage osteoarthritis of the left knee. 2. Gastroesophageal reflux disease and hiatal hernia. 3. Abdominal aortic aneurysm. 4. History of solitary kidney.  DISCHARGE DIAGNOSES: 1. End-stage osteoarthritis of the left knee status post left total knee    arthroplasty. 2. Gastroesophageal reflux disease and hiatal hernia. 3. Abdominal aortic aneurysm. 4. History of solitary kidney. 5. Postoperative hemorrhagic anemia, asymptomatic.  PROCEDURES:  Left total knee arthroplasty.  Surgeon: Georges Lynch. Darrelyn Hillock, M.D. Assistant: Kennieth Rad, M.D. under spinal anesthesia.  BRIEF HISTORY:  This is an 77 year old with end-stage osteoarthritis affecting the left knee.  He has failed conservative measures including corticosteroid injections and nonsteroidals.  At this time, he wished to proceed with total knee arthroplasty.  Quite functional and having significant affect of his ADLs.  Risks and benefits were discussed with the patient, and he is willing to proceed.  HOSPITAL COURSE:  The patient was admitted and underwent the above-mentioned procedure.  He tolerated this well.  All appropriate IV antibiotics and analgesics were provided.  Postoperatively, he was placed on Coumadin for and PE DVT prophylaxis.  He was weightbearing as tolerated with physical therapy and total knee replacement protocol.  He did have a postoperative hemorrhagic anemia that stabilized at 8.7 and 9.0 prior to discharge.  He remained asymptomatic and, therefore, was placed on iron replacement supplements. Overall, the patient remained hemodynamically, medically, and orthopedically stable and worked with physical therapy and  had met all discharge plans and goals prior to discharge on July 15, 2000.  He was afebrile.  He was neurovascularly intact.  CPM was at 90 degrees.  Incisions were stable, dry, with no drainage.  He is independent with ambulation.  At this time, he is felt medically and orthopedically stable for discharge to home.  LABORATORY DATA:  Admission hemogram showed hemoglobin 14.5, postoperatively 12.0, 10.2, 9.7, 8.7 and 9.0.  Pro time and INR followed by pharmacy. DVT and PE prophylaxis with Coumadin.  Chemistries were normal on admission. Urinalysis showed moderate leukocyte esterase and many bacteria.  A repeat is not shown on the chart.  EKG showed sinus bradycardia, otherwise normal.  CONDITION UPON DISCHARGE:  Stable and improved.  DISCHARGE PLAN:  Discharged to home.  Follow up in our office two weeks postoperatively.  Outpatient physical therapy is arranged.  He will have outpatient lab draws for pro times, daily dressing changes p.r.n.  Resume home medications and home diet.  DISCHARGE MEDICATIONS: 1. Coumadin 5 mg daily. 2. Percocet, #40, 1 p.o. q.4-6h. p.r.n. pain. 3. Trinsicon 1 p.o. b.i.d. DD:  08/29/00 TD:  08/30/00 Job: [id number]

## 2011-04-07 NOTE — H&P (Signed)
[place]  Patient:    Michael Sexton, Michael Sexton Visit Number: [id number] MRN: [id number]          Service Type: Attending:  Guadelupe Sabin, M.D. Adm. Date:  08/20/01                           History and Physical  This was a planned outpatient surgical admission of this 77 year old white male admitted for cataract implant surgery of the right eye.  HISTORY OF PRESENT ILLNESS:  This patient has been noted to have deterioration in vision in both eyes due to progressive cataract formation.  He was previously admitted on 01/03/00 and had cataract implant surgery of the left eye without complication.  The patient did well following this procedure and now is ready to proceed with similar surgery of the right eye, as vision has deteriorated to 20/60.  He has signed and informed consent and arrangements made for his outpatient admission authorized by his [employer].  PAST MEDICAL HISTORY:  Stable general health.  See old chart.  REVIEW OF SYSTEMS:  No cardiorespiratory complaints.  PHYSICAL EXAMINATION:  VITAL SIGNS:  As recorded on admission - blood pressure 133/70, heart rate 61, respirations 16, temperature 97.7.  GENERAL:  The patient is a pleasant, alert, well-nourished, well-developed white male in no acute ocular distress.  Visual acuity as recorded above 20/60 right eye, 20/30 left eye.  SLIT LAMP EXAMINATION:  Nuclear cataract formation right eye.  Posterior chamber implant left eye.  Posterior capsule clear.  FUNDUS EXAMINATION:  Right eye shows a choroidal nevus adjacent to the optic nerve.  Applanation tonometry 15 mm each eye.  CHEST:  Lungs clear to percussion and auscultation.  HEART:  Normal sinus rhythm.  No cardiomegaly.  No murmurs.  ABDOMEN:  Negative.  EXTREMITIES:  Negative.  ADMISSION DIAGNOSES:  Senile cataract right eye, pseudophakia left eye.  SURGICAL PLAN:  Cataract implant surgery, right eye  now. Attending:  Guadelupe Sabin, M.D. DD:  08/20/01 TD:  08/20/01 Job: [id number]

## 2011-04-07 NOTE — Discharge Summary (Signed)
NAME:  Michael Sexton, Michael Sexton NO.:  [id number]   MEDICAL RECORD NO.:  [id number]          PATIENT TYPE:  OBV   LOCATION:  3038                         FACILITY:  [facility]   PHYSICIAN:  Thora Lance, M.D.  DATE OF BIRTH:  Jul 09, 1917   DATE OF ADMISSION:  06/29/2006  DATE OF DISCHARGE:  07/02/2006                                 DISCHARGE SUMMARY   REASON FOR ADMISSION:  This is an 77 year old white male who presented with  dizziness on standing, diarrhea, and symptoms of Parkinsonism.   SIGNIFICANT FINDINGS:  Blood pressure is 130/70 sitting, 110/60 standing.  Heart rate 80, temperature 98.0.  Neurologic she had mild cogwheeling,  shuffling gait, and gross tremor on the hand.   LABORATORIES AT ADMISSION:  CBC WBC 8.4, hemoglobin 12.8, platelet 177, PT  14.1, PTT 28, sodium 137, potassium 4.1, chloride 106, bicarbonate 26,  glucose 96, BUN 20, creatinine 0.9, calcium 9.1, sodium protein 56.6,  albumin 3.8, AST 16, ALT 13, alk phos 53, total bilirubin 0.7.   HOSPITAL COURSE:  1. Dehydration.  The patient was admitted and treated with intravenous      fluids for mild dehydration.  His BUN and creatinine dropped 9 and 0.9      by the time he was discharged.  His orthostasis improved.  His      intravenous fluids were discontinued one day prior to discharge.  2. Parkinsonism.  The patient had obvious signs of parkinsonism with      tremor, some rigidity, and gait disorder.  He was started on Sinemet      20/100 one-half t.i.d. before meals.  Within two days there seemed to      be some improvement with improving gait and less tremor.  He was seen      by physical therapy who felt that he could be discharged home with the      use of a rolling walker.  Home health safety evaluation at home was      recommended.  3. Depression.  The patient did not feel like he was attaining any benefit      from his Zoloft.  This was discontinued.  As per neurology, it was      recommended  that if an anti-depressant was needed in the future,      Effexor would be more appropriate for Parkinson's disease.  4. Constipation.  The patient has not had a bowel movement in the last      three days.  He was prescribed MiraLax 17 grams daily as needed for a      bowel movement to take at home.   DISCHARGE DIAGNOSES:  1. Dehydration.  2. Parkinson's disease.  3. Depression.   PROCEDURES:  None.   DISCHARGE MEDICATIONS:  1. Sinemet 20/100 one-half t.i.d. before meals.  2. Multivitamin daily.  3. MiraLax 17 grams with water daily p.r.n. constipation.   ACTIVITY:  Rolling walker.  Home health safety evaluation arranged.  As  tolerated.   DIET:  Regular diet.   FOLLOWUP:  One week Dr. Valentina Lucks.  ______________________________  Thora Lance, M.D.     JJG/MEDQ  D:  07/02/2006  T:  07/02/2006  Job:  [id number]

## 2011-04-07 NOTE — H&P (Signed)
NAME:  Michael Sexton, Michael Sexton NO.:  [id number]   MEDICAL RECORD NO.:  [id number]          PATIENT TYPE:  OBV   LOCATION:  3038                         FACILITY:  [facility]   PHYSICIAN:  Thora Lance, M.D.  DATE OF BIRTH:  17-Feb-1917   DATE OF ADMISSION:  06/29/2006  DATE OF DISCHARGE:                                HISTORY & PHYSICAL   CHIEF COMPLAINT:  This is an 77 year old white male with complaint of  dizziness, weakness, and paleness.   HISTORY OF PRESENT ILLNESS:  Since being seen on July 22, the patient got  constipated and over used prescribed laxatives causing diarrhea. He became  dizzy with position changes and almost fell yesterday. His family brought  him today for evaluation. His orthostatic and drops from a systolic blood  pressure of 130 to 110. He is mildly dehydrated and at risk of fall because  of his parkinsonism and his unsteady gait. Decision was made to admit him.   DRUG ALLERGIES:  DEMEROL MAKES HIM CRAZY.   CURRENT MEDICATIONS:  1. Multivitamin one a day.  2. Zoloft 1/2 tablet 1 a day.  3. Vicodin 5/500 1 p.o. q.6h. p.r.n.   PAST MEDICAL HISTORY:  1. Right shoulder pain.  2. Labile mood.  3. Skin lesion.  4. Tremor awaiting neuro evaluation.  5. Constipation.  6. Possible parkinsonism.  7. Gout (no exacerbation in years).  8. Left knee DJD. Status post total knee replacement.  9. Mild carpal tunnel syndrome.  10.Herpes zoster 1998.  11.Basal cell carcinoma, right upper back in 1999 and left lateral neck.  12.History of hypertension although he is normotensive now off medicines.  13.Macular degeneration, seen by [facility].   PAST SURGICAL HISTORY:  1. Left nephrectomy in 1942.  2. Cholecystectomy 1993.  3. AAA in June of 1999 by Dr. Hart Rochester.  4. Basal cell carcinoma October 1999 Dr. Lonni Fix.  5. Hydrocele surgery.  6. Left cataract surgery in 2002.  7. Left total knee replacement by Dr. Darrelyn Hillock.  8. Right  cataract surgery 2000 Dr. Cecilie Kicks.   FAMILY HISTORY:  Father died age 59 of a MI. Mother died age 70 of brain  cancer. He had 2 brothers, both with coronary artery disease in their 43s.  He had one son who died of metastatic cancer; primary site is unknown. One  son died of pneumonia.   HABITS:  Smoking none. Alcohol:  He has cut down and does not drink daily.   SOCIAL HISTORY:  Three daughters. One son. He was formally a Environmental education officer, now retired.   REVIEW OF SYSTEMS:  See chief complaint and history of present illness.   PHYSICAL EXAMINATION:  GENERAL APPEARANCE:  Frail elderly male with  significant tremor and dizziness upon changing position.  VITAL SIGNS:  Weight 154, temperature 98.0, pulse 80, blood pressure 130/70  sitting - 110/60 standing.  HEENT:  Eyes:  PERRL. EOMs are intact. Ears clear. Oropharynx is clear with  tongue slightly dry.  NECK:  Supple with no lymphadenopathy or thyromegaly. No carotid bruit.  LUNGS:  Clear.  HEART:  Regular rate and rhythm without murmurs, rubs, gallops or click.  ABDOMEN:  Soft, nontender, bowel sounds present.  EXTREMITIES:  Significant tremor at rest that worsens with activity. No  edema. Pulses +2/2.  NEUROLOGICAL:  Mild cogwheeling on the left only. Gait shuffling. Skin is  clear.  MUSCULOSKELETAL:  He cannot abduct his right shoulder.   ASSESSMENT:  Mild dehydration in a parkinsonism patient with unsteady gait.   PLAN:  Admit for further evaluation and treatment. Consult with Dr. Merlene Laughter.      Lavinia Sharps, N.P.    ______________________________  Thora Lance, M.D.    MAP/MEDQ  D:  06/29/2006  T:  06/29/2006  Job:  [id number]

## 2011-04-21 DEATH — deceased

## 2012-09-08 IMAGING — CR DG CHEST 1V PORT
1 series · 1 of 1 positions shown · non-contrast
Comparison: 04/10/2010

CLINICAL DATA: Abdominal pain.

PORTABLE CHEST - 1 VIEW

[view not recorded]
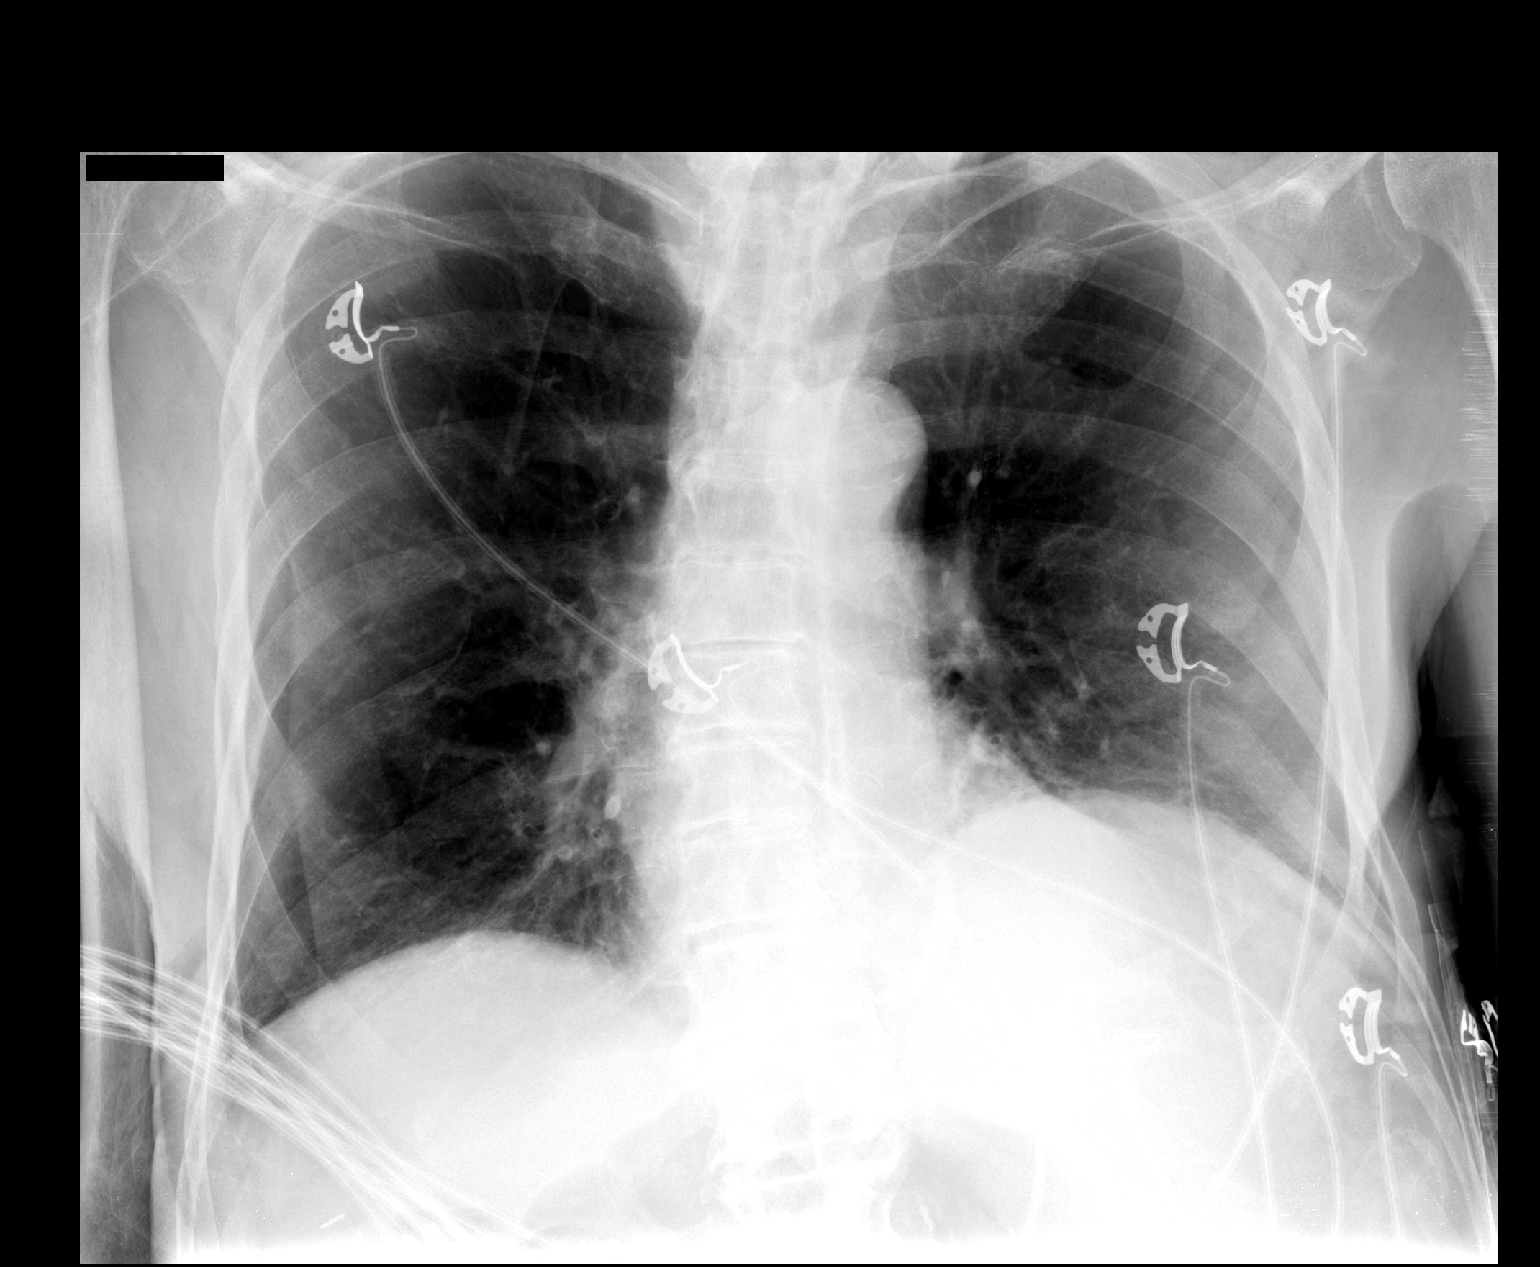

[1 of 1 positions shown; findings below may reference images not displayed]

FINDINGS: Film is very shallow lung inflation.  There is elevation
of the left hemidiaphragm.  Heart size is within normal limits.
There are perihilar bronchitic changes.  Density at the left lung
base may be related to scarring/bronchiectasis.  No evidence for
pulmonary edema.
IMPRESSION: 1.  Bronchitic changes.
2.  Persistent left lower lobe opacity, consistent with scarring
versus bronchiectasis.

## 2012-09-10 IMAGING — CT CT ABDOMEN W/ CM
2 of 5 series · 13 of 32 positions shown, 19 images · IV contrast (water/omni  & 80ml omni 300)
Comparison: None.

CLINICAL DATA: Abdominal pain.  Prior left nephrectomy.
Hematemesis.  Gastritis.

CT ABDOMEN WITH CONTRAST
TECHNIQUE: Multidetector CT imaging of the abdomen was performed
using the standard protocol following bolus administration of
intravenous contrast.
Contrast: 80 ml Pmnipaque-DWW

[Series 102: routine abdomen · axial · 0.84mm/px · z∈[-148,+90]mm · 10 of 255 slices shown, 16 images]
[im 24/255  soft-tissue]
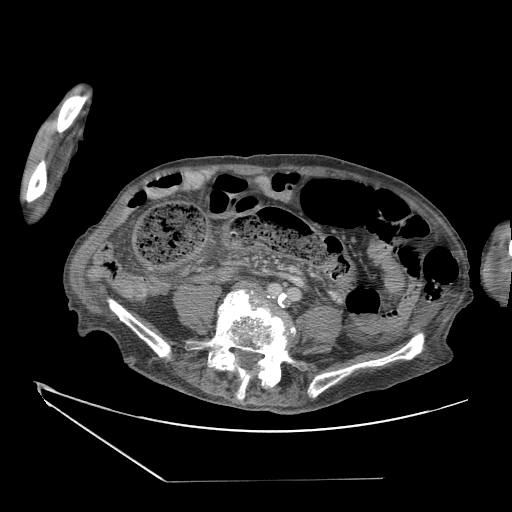
[im 24/255  bone]
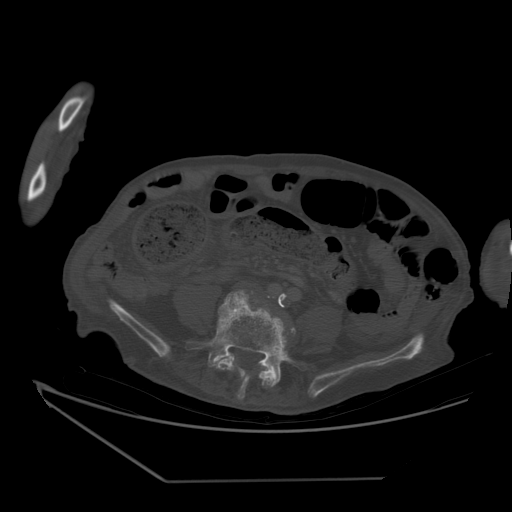
[im 47/255  soft-tissue]
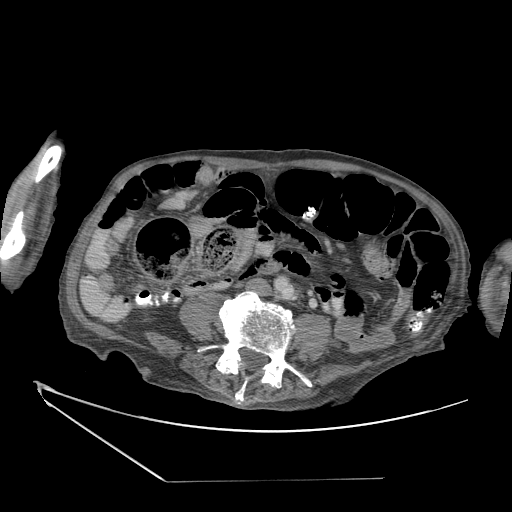
[im 70/255  soft-tissue]
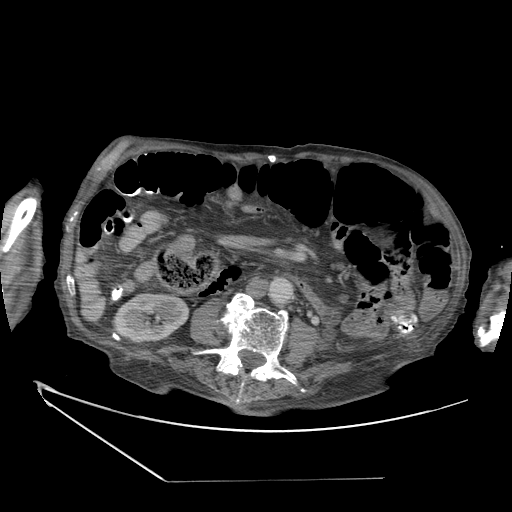
[im 93/255  soft-tissue]
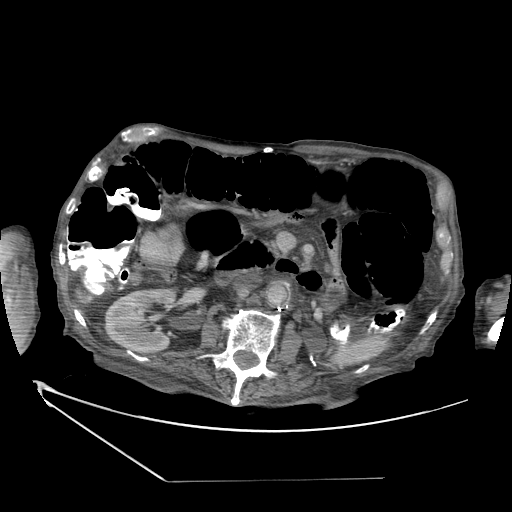
[im 116/255  soft-tissue]
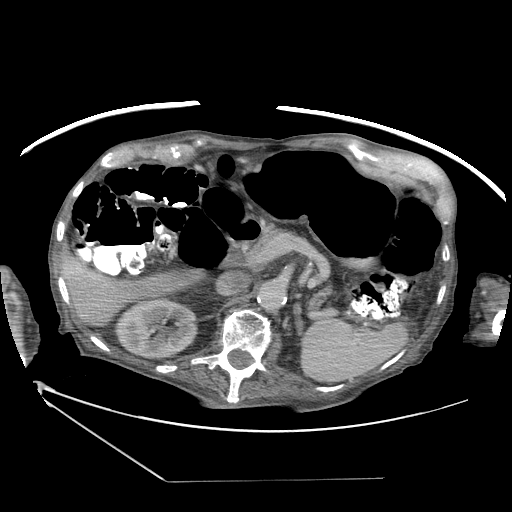
[im 139/255  soft-tissue]
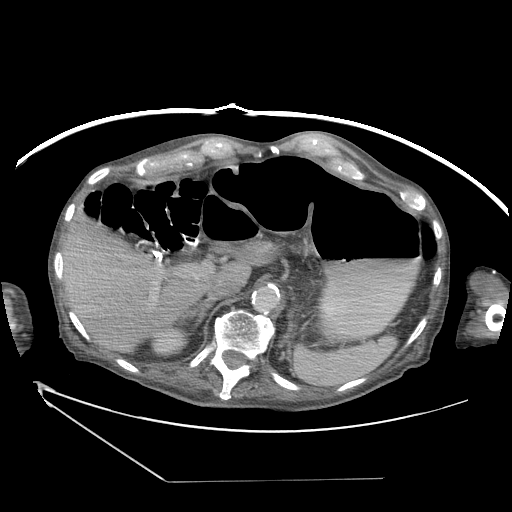
[im 162/255  soft-tissue]
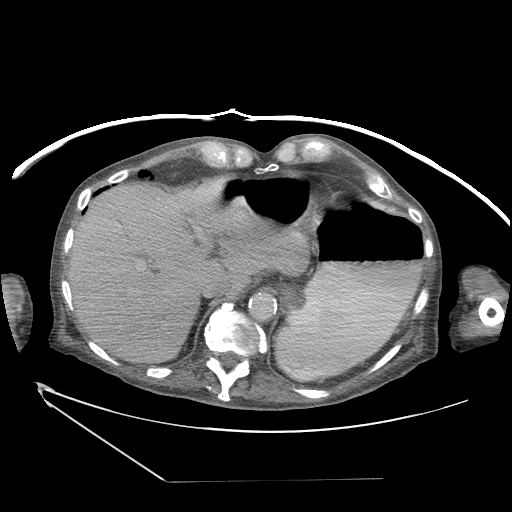
[im 162/255  lung]
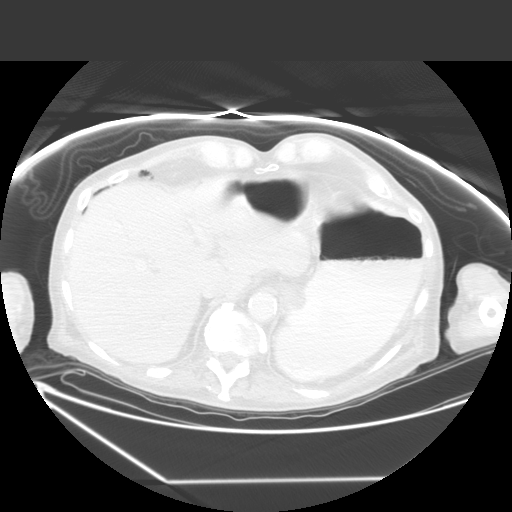
[im 185/255  soft-tissue]
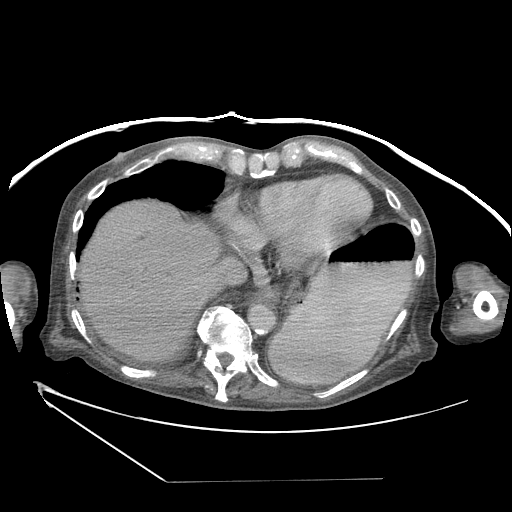
[im 185/255  lung]
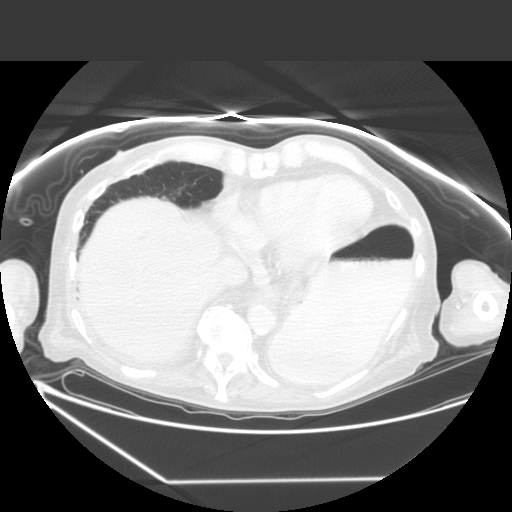
[im 208/255  soft-tissue]
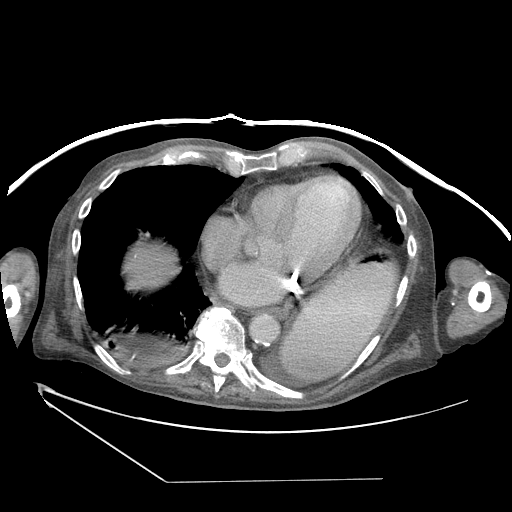
[im 208/255  lung]
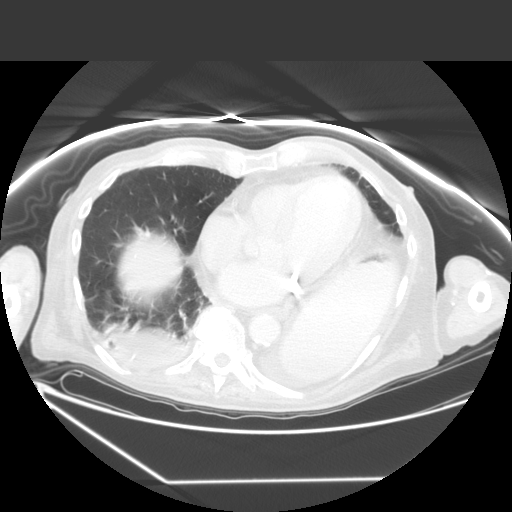
[im 208/255  bone]
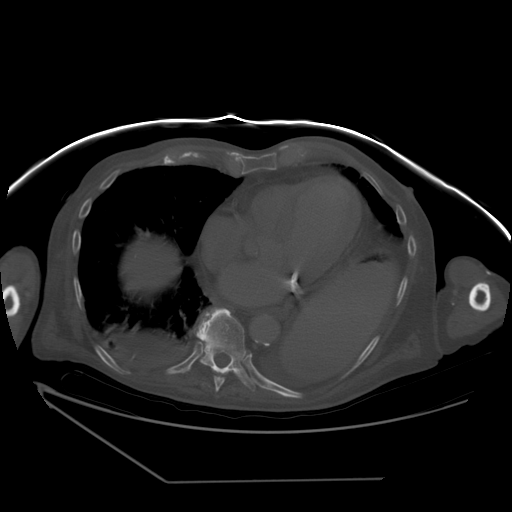
[im 231/255  soft-tissue]
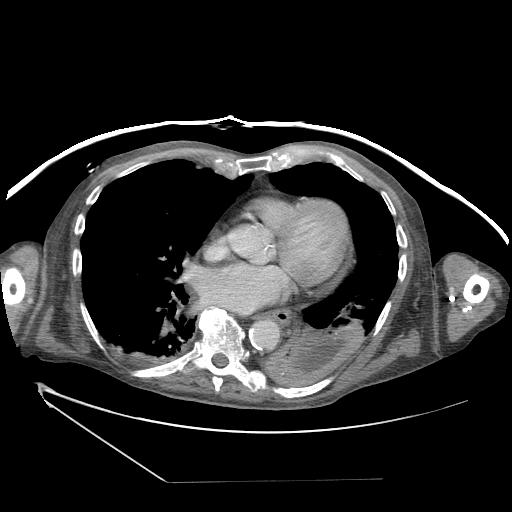
[im 231/255  lung]
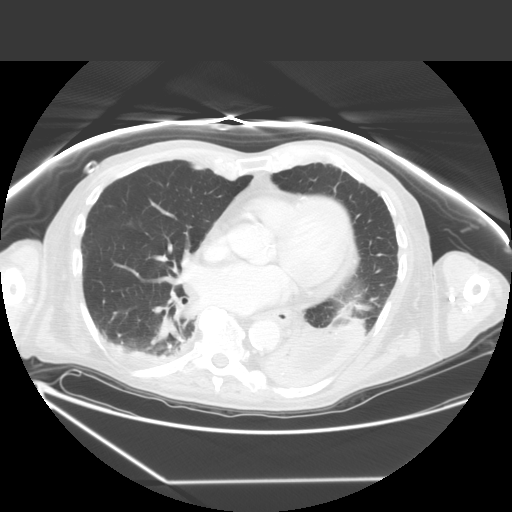

[Series 103: reformatted · sagittal · 0.84mm/px · 3 of 192 slices shown]
[im 24/192  soft-tissue]
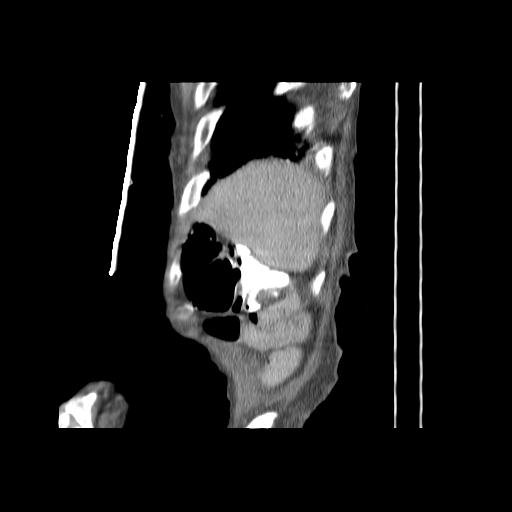
[im 48/192  soft-tissue]
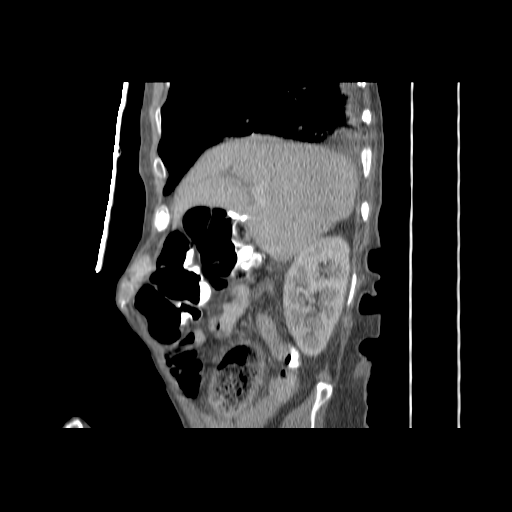
[im 72/192  soft-tissue]
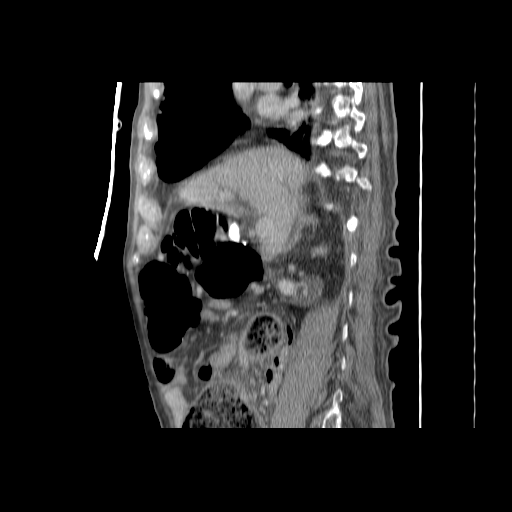

[13 of 32 positions shown; findings below may reference images not displayed]

FINDINGS: Small bilateral pleural effusions are present.  There is
volume loss in the left lower lobe subpulmonic effusion.  Coronary
artery atherosclerosis is present.

Prior cholecystectomy noted, with borderline intrahepatic biliary
dilatation, some of which may be physiologic.

There is gas and contrast in the stomach.  The duodenal bulb
appears dilated.  I do not discern a gastric mass.  No
gastrohepatic ligament adenopathy noted.

The spleen and adrenal glands appear unremarkable.  The pancreas
appears atrophic but otherwise unremarkable.

Aortoiliac stent graft noted.

Left nephrectomy noted, with a small fluid collection in the
nephrectomy bed, of uncertain significance the

The visualized colon is predominately gas filled.  However , the
sigmoid colon is distended and filled with stool, with surrounding
low-level mesenteric edema.  Fecal impaction is not excluded.

There is some atherosclerotic intimal thickening and calcification
in the superior mesenteric artery, without occlusion.

Orally administered contrast makes its way through to the colon.

Lumbar spondylosis is present.
IMPRESSION: 1.  Bilateral small  pleural effusions, subpulmonic on the left.
2.  Coronary atherosclerosis.
3.  No gastric mass is identified although the stomach and duodenal
bulb appears distended.
4.  The sigmoid colon appears prominently distended, query fecal
impaction.  There is mild stranding in the mesentery adjacent to
the sigmoid colon.
5.  Lumbar spondylosis.
6.  Small fluid collection in the left nephrectomy bed, likely
chronic.
